# Patient Record
Sex: Female | Born: 1980 | Race: Black or African American | Hispanic: No | Marital: Single
Health system: Southern US, Community
[De-identification: ages and names within clinical notes are randomized; demographics above are authoritative.]

---

## 2021-11-16 ENCOUNTER — Emergency Department (HOSPITAL_BASED_OUTPATIENT_CLINIC_OR_DEPARTMENT_OTHER): Payer: Self-pay

## 2021-11-16 ENCOUNTER — Other Ambulatory Visit: Payer: Self-pay

## 2021-11-16 ENCOUNTER — Encounter (HOSPITAL_BASED_OUTPATIENT_CLINIC_OR_DEPARTMENT_OTHER): Payer: Self-pay

## 2021-11-16 ENCOUNTER — Emergency Department (HOSPITAL_BASED_OUTPATIENT_CLINIC_OR_DEPARTMENT_OTHER)
Admission: EM | Admit: 2021-11-16 | Discharge: 2021-11-16 | Disposition: A | Discharge status: Home / Self Care | Payer: Self-pay | Attending: Emergency Medicine | Admitting: Emergency Medicine

## 2021-11-16 DIAGNOSIS — R748 Abnormal levels of other serum enzymes: Secondary | ICD-10-CM | POA: Insufficient documentation

## 2021-11-16 DIAGNOSIS — K859 Acute pancreatitis without necrosis or infection, unspecified: Secondary | ICD-10-CM | POA: Insufficient documentation

## 2021-11-16 DIAGNOSIS — B9689 Other specified bacterial agents as the cause of diseases classified elsewhere: Secondary | ICD-10-CM

## 2021-11-16 DIAGNOSIS — A5901 Trichomonal vulvovaginitis: Secondary | ICD-10-CM | POA: Insufficient documentation

## 2021-11-16 DIAGNOSIS — A599 Trichomoniasis, unspecified: Secondary | ICD-10-CM

## 2021-11-16 LAB — COMPREHENSIVE METABOLIC PANEL
ALT: 14 U/L (ref 0–44)
AST: 23 U/L (ref 15–41)
Albumin: 4.6 g/dL (ref 3.5–5.0)
Alkaline Phosphatase: 63 U/L (ref 38–126)
Anion gap: 13 (ref 5–15)
BUN: 8 mg/dL (ref 6–20)
CO2: 26 mmol/L (ref 22–32)
Calcium: 9.7 mg/dL (ref 8.9–10.3)
Chloride: 93 mmol/L — ABNORMAL LOW (ref 98–111)
Creatinine, Ser: 0.87 mg/dL (ref 0.44–1.00)
GFR, Estimated: >60 mL/min (ref >60)
Glucose, Bld: 126 mg/dL — ABNORMAL HIGH (ref 70–99)
Potassium: 3.5 mmol/L (ref 3.5–5.1)
Sodium: 132 mmol/L — ABNORMAL LOW (ref 135–145)
Total Bilirubin: 0.8 mg/dL (ref 0.3–1.2)
Total Protein: 8.8 g/dL — ABNORMAL HIGH (ref 6.5–8.1)

## 2021-11-16 LAB — URINALYSIS, MICROSCOPIC (REFLEX)

## 2021-11-16 LAB — WET PREP, GENITAL
Sperm: NONE SEEN
WBC, Wet Prep HPF POC: >=10 — AB (ref <10)
Yeast Wet Prep HPF POC: NONE SEEN

## 2021-11-16 LAB — URINALYSIS, ROUTINE W REFLEX MICROSCOPIC
Glucose, UA: NEGATIVE mg/dL
Hgb urine dipstick: NEGATIVE
Ketones, ur: NEGATIVE mg/dL
Nitrite: POSITIVE — AB
Protein, ur: 100 mg/dL — AB
Specific Gravity, Urine: 1.025 (ref 1.005–1.030)
pH: 6 (ref 5.0–8.0)

## 2021-11-16 LAB — CBC
HCT: 48.3 % — ABNORMAL HIGH (ref 36.0–46.0)
Hemoglobin: 16.3 g/dL — ABNORMAL HIGH (ref 12.0–15.0)
MCH: 30.4 pg (ref 26.0–34.0)
MCHC: 33.7 g/dL (ref 30.0–36.0)
MCV: 90.1 fL (ref 80.0–100.0)
Platelets: 260 10*3/uL (ref 150–400)
RBC: 5.36 MIL/uL — ABNORMAL HIGH (ref 3.87–5.11)
RDW: 14.8 % (ref 11.5–15.5)
WBC: 5.1 10*3/uL (ref 4.0–10.5)
nRBC: 0 % (ref 0.0–0.2)

## 2021-11-16 LAB — PREGNANCY, URINE: Preg Test, Ur: NEGATIVE

## 2021-11-16 LAB — LIPASE, BLOOD: Lipase: 69 U/L — ABNORMAL HIGH (ref 11–51)

## 2021-11-16 MED ORDER — PANTOPRAZOLE SODIUM 20 MG PO TBEC
20.0000 mg | DELAYED_RELEASE_TABLET | Freq: Every day | ORAL | 0 refills | Status: AC
Start: 2021-11-16 — End: 2021-12-16

## 2021-11-16 MED ORDER — OXYCODONE-ACETAMINOPHEN 5-325 MG PO TABS: 1.0000 | ORAL_TABLET | Freq: Four times a day (QID) | ORAL | 0 refills | Status: AC | PRN

## 2021-11-16 MED ORDER — IOHEXOL 300 MG/ML  SOLN
100.0000 mL | Freq: Once | INTRAMUSCULAR | Status: AC | PRN
  Administered 2021-11-16: 100 mL via INTRAVENOUS

## 2021-11-16 MED ORDER — ONDANSETRON HCL 4 MG/2ML IJ SOLN
4.0000 mg | Freq: Once | INTRAMUSCULAR | Status: AC
  Administered 2021-11-16: 4 mg via INTRAVENOUS
  Filled 2021-11-16: qty 2

## 2021-11-16 MED ORDER — ONDANSETRON 4 MG PO TBDP: 4.0000 mg | ORAL_TABLET | Freq: Three times a day (TID) | ORAL | 0 refills | Status: AC | PRN

## 2021-11-16 MED ORDER — MORPHINE SULFATE (PF) 4 MG/ML IV SOLN
4.0000 mg | Freq: Once | INTRAVENOUS | Status: AC
  Administered 2021-11-16: 4 mg via INTRAVENOUS
  Filled 2021-11-16: qty 1

## 2021-11-16 MED ORDER — DOXYCYCLINE HYCLATE 100 MG PO CAPS: 100.0000 mg | ORAL_CAPSULE | Freq: Two times a day (BID) | ORAL | 0 refills | Status: AC

## 2021-11-16 MED ORDER — LIDOCAINE HCL (PF) 1 % IJ SOLN
1.0000 mL | Freq: Once | INTRAMUSCULAR | Status: AC
  Administered 2021-11-16: 1 mL
  Filled 2021-11-16: qty 5

## 2021-11-16 MED ORDER — METRONIDAZOLE 500 MG PO TABS: 500.0000 mg | ORAL_TABLET | Freq: Two times a day (BID) | ORAL | 0 refills | Status: AC

## 2021-11-16 MED ORDER — CEFTRIAXONE SODIUM 500 MG IJ SOLR
500.0000 mg | Freq: Once | INTRAMUSCULAR | Status: AC
  Administered 2021-11-16: 500 mg via INTRAMUSCULAR
  Filled 2021-11-16: qty 500

## 2021-11-16 MED ORDER — SODIUM CHLORIDE 0.9 % IV BOLUS
1000.0000 mL | Freq: Once | INTRAVENOUS | Status: AC
  Administered 2021-11-16: 1000 mL via INTRAVENOUS

## 2021-11-16 NOTE — ED Triage Notes (Signed)
Pt c/o epigastric pain radiating to her flank for approximately one week. Pt also endorses N/V during that time.

## 2021-11-16 NOTE — ED Provider Notes (Signed)
[facility] EMERGENCY DEPARTMENT Provider Note   CSN: [id number] Arrival date & time: 11/16/21  1614     History  Chief Complaint  Patient presents with   Abdominal Pain    Lindsay Tapia is a 41 y.o. female with no reported past medical history.  Presents to the emergency department with a chief complaint of abdominal pain and thoracic back pain.  Patient reports that abdominal pain has been present over the last week.  Pain is located to her epigastric area.  Pain radiates to bilateral flanks.  Patient states that pain has been constant however waxes and wanes in intensity.  Patient endorses nausea and vomiting.  States that he is having difficulty tolerating p.o. intake.  Patient reports vomiting 7 times in the last 24 hours.  Describes emesis as stomach contents and bilious.  Patient endorses thoracic back pain over the last 4 days.  Pain has been constant over this time.  Pain waxes and wanes in intensity.  Patient has tried heat and ice with no improvement in her pain.  Denies any recent falls or injuries.  LMP 12/21.  Patient is not on any forms of birth control.  Patient is sexually active in a mutually monogamous relationship with a female partner.  G 11 P00 11 0.  Patient reports drinking 4 alcoholic drinks per day.  Patient denies any illicit drug use.  Denies any history of abdominal surgeries.  Patient denies any fever, chills, dysuria, hematuria, urinary urgency, vaginal pain, vaginal bleeding, vaginal discharge, abdominal distention, blood in stool, melena, constipation, diarrhea.   Abdominal Pain Associated symptoms: nausea and vomiting   Associated symptoms: no chest pain, no chills, no constipation, no diarrhea, no dysuria, no fever, no hematuria, no shortness of breath, no vaginal bleeding and no vaginal discharge       Home Medications Prior to Admission medications   Not on File      Allergies    Pineapple    Review of Systems   Review of Systems   Constitutional:  Negative for chills and fever.  Eyes:  Negative for visual disturbance.  Respiratory:  Negative for shortness of breath.   Cardiovascular:  Negative for chest pain.  Gastrointestinal:  Positive for abdominal pain, nausea and vomiting. Negative for abdominal distention, anal bleeding, blood in stool, constipation, diarrhea and rectal pain.  Genitourinary:  Negative for difficulty urinating, dysuria, flank pain, frequency, genital sores, hematuria, pelvic pain, urgency, vaginal bleeding, vaginal discharge and vaginal pain.  Musculoskeletal:  Positive for back pain. Negative for neck pain.  Skin:  Negative for color change and rash.  Neurological:  Negative for dizziness, syncope, light-headedness and headaches.  Psychiatric/Behavioral:  Negative for confusion.    Physical Exam Updated Vital Signs BP (!) 186/126 (BP Location: Left Arm)    Pulse (!) 118    Temp 98.3 F (36.8 C) (Oral)    Resp 18    Ht 5\' 9"  (1.753 m)    Wt 89.8 kg    SpO2 100%    BMI 29.24 kg/m  Physical Exam Vitals and nursing note reviewed. Exam conducted with a chaperone present (Female nurse tech present as chaperone).  Constitutional:      General: She is not in acute distress.    Appearance: She is not ill-appearing, toxic-appearing or diaphoretic.  HENT:     Head: Normocephalic.  Eyes:     General: No scleral icterus.       Right eye: No discharge.  Left eye: No discharge.  Cardiovascular:     Rate and Rhythm: Normal rate.  Pulmonary:     Effort: Pulmonary effort is normal.  Abdominal:     General: Abdomen is protuberant. Bowel sounds are normal. There is no distension. There are no signs of injury.     Palpations: Abdomen is soft. There is no mass or pulsatile mass.     Tenderness: There is abdominal tenderness in the right upper quadrant and epigastric area. There is no right CVA tenderness, left CVA tenderness, guarding or rebound. Positive signs include Murphy's sign.     Hernia:  There is no hernia in the umbilical area, ventral area, left inguinal area or right inguinal area.  Genitourinary:    Exam position: Knee-chest position.     Pubic Area: No rash or pubic lice.      Tanner stage (genital): 5.     Labia:        Right: No rash, tenderness, lesion or injury.        Left: No rash, tenderness, lesion or injury.      Vagina: No signs of injury and foreign body. Vaginal discharge present. No erythema, tenderness, bleeding, lesions or prolapsed vaginal walls.     Cervix: No cervical motion tenderness, discharge, friability, lesion, erythema, cervical bleeding or eversion.     Uterus: Not enlarged and not tender.      Adnexa: Right adnexa normal and left adnexa normal.     Comments: M5 moderate amount of white discharge noted to vaginal vault. Lymphadenopathy:     Lower Body: No right inguinal adenopathy. No left inguinal adenopathy.  Skin:    General: Skin is warm and dry.  Neurological:     General: No focal deficit present.     Mental Status: She is alert.  Psychiatric:        Behavior: Behavior is cooperative.    ED Results / Procedures / Treatments   Labs (all labs ordered are listed, but only abnormal results are displayed) Labs Reviewed  WET PREP, GENITAL - Abnormal; Notable for the following components:      Result Value   Trich, Wet Prep PRESENT (*)    Clue Cells Wet Prep HPF POC PRESENT (*)    WBC, Wet Prep HPF POC >=10 (*)    All other components within normal limits  LIPASE, BLOOD - Abnormal; Notable for the following components:   Lipase 69 (*)    All other components within normal limits  COMPREHENSIVE METABOLIC PANEL - Abnormal; Notable for the following components:   Sodium 132 (*)    Chloride 93 (*)    Glucose, Bld 126 (*)    Total Protein 8.8 (*)    All other components within normal limits  CBC - Abnormal; Notable for the following components:   RBC 5.36 (*)    Hemoglobin 16.3 (*)    HCT 48.3 (*)    All other components within  normal limits  URINALYSIS, ROUTINE W REFLEX MICROSCOPIC - Abnormal; Notable for the following components:   APPearance CLOUDY (*)    Bilirubin Urine LARGE (*)    Protein, ur 100 (*)    Nitrite POSITIVE (*)    Leukocytes,Ua SMALL (*)    All other components within normal limits  URINALYSIS, MICROSCOPIC (REFLEX) - Abnormal; Notable for the following components:   Bacteria, UA FEW (*)    Trichomonas, UA PRESENT (*)    All other components within normal limits  PREGNANCY, URINE  GC/CHLAMYDIA PROBE  AMP ([place]) NOT AT [facility]    EKG None  Radiology CT ABDOMEN PELVIS W CONTRAST  Result Date: 11/16/2021 CLINICAL DATA:  Epigastric pain. EXAM: CT ABDOMEN AND PELVIS WITH CONTRAST TECHNIQUE: Multidetector CT imaging of the abdomen and pelvis was performed using the standard protocol following bolus administration of intravenous contrast. RADIATION DOSE REDUCTION: This exam was performed according to the departmental dose-optimization program which includes automated exposure control, adjustment of the mA and/or kV according to patient size and/or use of iterative reconstruction technique. CONTRAST:  161mL OMNIPAQUE IOHEXOL 300 MG/ML  SOLN COMPARISON:  None. FINDINGS: Lower chest: No acute abnormality. Hepatobiliary: There is focal fatty infiltration along the falciform ligament. The liver is otherwise within normal limits. Gallbladder and bile ducts are within normal limits. Pancreas: There is mild inflammatory stranding surrounding the head of the pancreas. No pancreatic ductal dilatation. No focal fluid collections. Spleen: Normal in size without focal abnormality. Adrenals/Urinary Tract: There is a rounded hypodensity in the left kidney which is too small to characterize, likely cysts. Otherwise, the kidneys, bladder and adrenal glands are within normal limits. Stomach/Bowel: There is a rounded 2.8 cm fluid attenuation structure in the gastrohepatic region adjacent to the gastroesophageal junction.  This is well-circumscribed without surrounding inflammation. Stomach is otherwise within normal limits. There is mild inflammatory stranding surrounding the duodenum. No bowel obstruction. Appendix within normal limits. Vascular/Lymphatic: Aortic atherosclerosis. No enlarged abdominal or pelvic lymph nodes. Reproductive: Uterus and bilateral adnexa are unremarkable. Other: No abdominal wall hernia or abnormality. No abdominopelvic ascites. Musculoskeletal: No acute or significant osseous findings. IMPRESSION: 1. Mild inflammation surrounding the head of the pancreas worrisome for acute uncomplicated pancreatitis. 2. Mild inflammation surrounding the duodenum may be reactive secondary to pancreatitis. Other etiologies for duodenitis not excluded. 3. Rounded fluid attenuation structure measuring 2.8 cm near the gastroesophageal junction. Differential diagnosis includes gastric duplication cyst, gastric diverticulum, exophytic liver cyst. Consider further evaluation with upper GI study or MRI if clinically warranted. Electronically Signed   By: Ronney Asters M.D.   On: 11/16/2021 19:10    Procedures Procedures    Medications Ordered in ED Medications  cefTRIAXone (ROCEPHIN) injection 500 mg (500 mg Intramuscular Given 11/16/21 1831)  lidocaine (PF) (XYLOCAINE) 1 % injection 1 mL (1 mL Other Given 11/16/21 1831)  ondansetron (ZOFRAN) injection 4 mg (4 mg Intravenous Given 11/16/21 1831)  morphine 4 MG/ML injection 4 mg (4 mg Intravenous Given 11/16/21 1830)  iohexol (OMNIPAQUE) 300 MG/ML solution 100 mL (100 mLs Intravenous Contrast Given 11/16/21 1844)  sodium chloride 0.9 % bolus 1,000 mL (0 mLs Intravenous Stopped 11/16/21 2017)    ED Course/ Medical Decision Making/ A&P                           Medical Decision Making Amount and/or Complexity of Data Reviewed Labs: ordered. Radiology: ordered.  Risk Prescription drug management.   This patient presents to the ED for concern of abdominal pain,  nausea, and vomiting, this involves an extensive number of treatment options, and is a complaint that carries with it a high risk of complications and morbidity.  The differential diagnosis includes but is not limited to acute pancreatitis, cholecystitis, renal/ureteral calculus, viral gastroenteritis.   Co morbidities that complicate the patient evaluation  N/a   Additional history obtained:  External records from outside source obtained and reviewed    Lab Tests:  I Ordered, and personally interpreted labs.  The pertinent results include:  Lipase elevated at 69 CBC shows hemoconcentration likely secondary to dehydration CMP shows sodium 132, chloride 93 UA shows bacteria few, WBC 6-10, leukocyte small, nitrite positive, trichomonas present Urine pregnancy is negative Wet prep shows trichomonas and clue cells present.   Imaging Studies ordered:  I ordered imaging studies including CT abdomen pelvis I independently visualized and interpreted imaging which showed mild inflammation surrounding head of the pancreas worrisome for acute uncomplicated pancreatitis, mild inflammation surrounding the duodenum to be reactive secondary to pancreatitis.  Rounded fluid attenuation structure measuring 2.8 cm near the gastroesophageal junction. I agree with the radiologist interpretation    Medicines ordered and prescription drug management:  I ordered medication including morphine for pain management.  Zofran for nausea Reevaluation of the patient after these medicines showed that the patient improved I have reviewed the patients home medicines and have made adjustments as needed  Problem List / ED Course:  Epigastric abdominal pain Abdomen soft, nondistended, tenderness to epigastric area.  Lipase elevated at 69.  Due to tenderness will obtain CT abdomen pelvis to evaluate for possible PE pancreatitis versus acute cholecystitis. CT results as noted above.  We will attempt to manage  patient's pain in the emergency department and p.o. challenge.  If able to tolerate p.o. intake we will plan to discharge with GI follow-up.  Patient able to handle p.o. intake without difficulty.  Pain is adequately managed at this time. Patient's urinalysis and wet prep showed trichomonas.  Patient denies any vaginal or urinary complaints.  Noted to have moderate amount of white discharge to vaginal vault on exam.  Patient empirically treated for gonorrhea and chlamydia.  We will treat patient with 7-day course of Flagyl.  Patient was offered testing for HIV and syphilis however declines at this time.    Patient care discussed with attending physician Dr.Yao    Reevaluation:  After the interventions noted above, I reevaluated the patient and found that they have :improved   Disposition:  After consideration of the diagnostic results and the patients response to treatment, I feel that the patent would benefit from discharge and follow-up with gastroenterologist in outpatient setting..          Final Clinical Impression(s) / ED Diagnoses Final diagnoses:  Acute pancreatitis without infection or necrosis, unspecified pancreatitis type  Trichomonas infection  BV (bacterial vaginosis)    Rx / DC Orders ED Discharge Orders          Ordered    oxyCODONE-acetaminophen (PERCOCET/ROXICET) 5-325 MG tablet  Every 6 hours PRN        11/16/21 2030    ondansetron (ZOFRAN-ODT) 4 MG disintegrating tablet  Every 8 hours PRN        11/16/21 2030    pantoprazole (PROTONIX) 20 MG tablet  Daily        11/16/21 2030    metroNIDAZOLE (FLAGYL) 500 MG tablet  2 times daily        11/16/21 2030    doxycycline (VIBRAMYCIN) 100 MG capsule  2 times daily        11/16/21 2030              Dyann Ruddle 11/16/21 2243    Drenda Freeze, MD 11/17/21 1550

## 2021-11-16 NOTE — Discharge Instructions (Addendum)
[facility] to be evaluated for your abdominal pain.  Your CT scan was consistent with acute pancreatitis.  Due to this you need to use a clear liquid diet for the next 3 to 5 days.  After that you may progress to a bland diet and then to a normal diet as tolerated.  I have given you prescription for Percocet pain medication, you may take 1 pill every 6 hours as needed for severe pain.  I have also given you prescription for Zofran, you may take 1 pill every 8 hours as needed for nausea/vomiting.  Please follow-up with the gastroenterologist listed on this paperwork for further evaluation.  Your urinalysis and wet prep showed you are positive for trichomonas and bacterial vaginosis.  Due to this you were started on the medication Flagyl.  Do not drink alcohol with this medication as it will cause violent illness.  You were also empirically treated for gonorrhea and chlamydia.  You have a prescription for doxycycline.  This medication can cause increased sensitivity to the sun.  Please follow-up with the OB/GYN listed on this paperwork.  Please do not have sex for the next 2 hours.  Please tell any sexual partners about your positive test results.  Get help right away if: You cannot eat or keep fluids down. Your pain becomes severe. Your skin or the white part of your eyes turns yellow (jaundice). You have sudden swelling in your abdomen. You vomit. You feel dizzy or you faint. Your blood sugar is high (over 300 mg/dL).

## 2021-11-16 NOTE — ED Notes (Signed)
Pt provided with ice for her Gatorade.

## 2021-11-17 LAB — GC/CHLAMYDIA PROBE AMP (~~LOC~~) NOT AT ARMC
Chlamydia: NEGATIVE
Comment: NEGATIVE
Comment: NORMAL
Neisseria Gonorrhea: NEGATIVE

## 2023-01-07 IMAGING — CT CT ABD-PELV W/ CM
2 of 4 series · 16 of 46 positions shown, 18 images · IV contrast (agent unspecified)
Comparison: None.

CLINICAL DATA: Epigastric pain.

EXAM:
CT ABDOMEN AND PELVIS WITH CONTRAST
TECHNIQUE: Multidetector CT imaging of the abdomen and pelvis was performed
using the standard protocol following bolus administration of
intravenous contrast.

[Series 2: axial st · axial · 0.97mm/px · z∈[+809,+1224]mm · 13 of 91 slices shown, 15 images]
[im 4/91  soft-tissue]
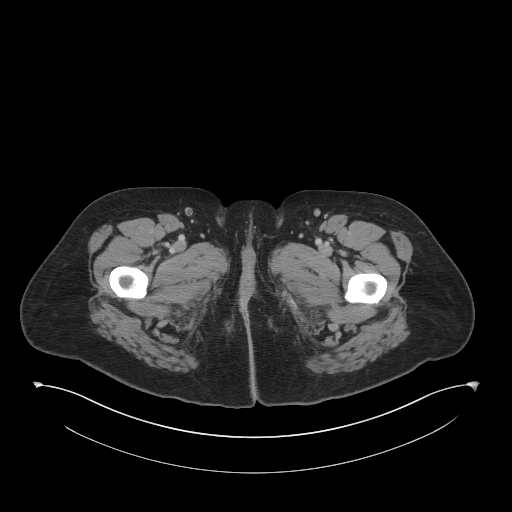
[im 4/91  bone]
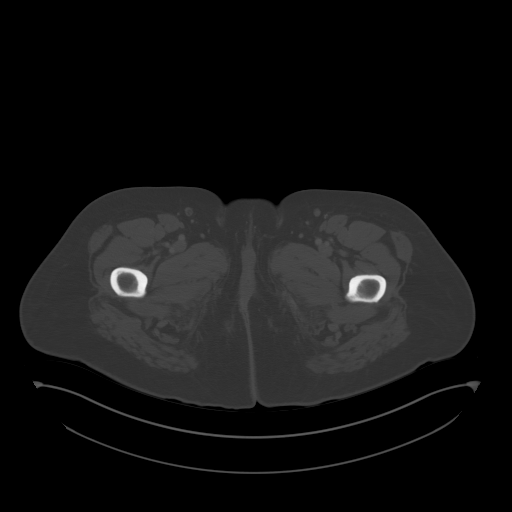
[im 12/91  soft-tissue]
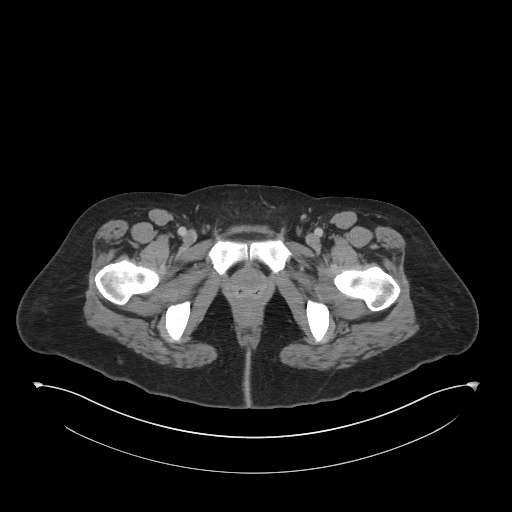
[im 19/91  soft-tissue]
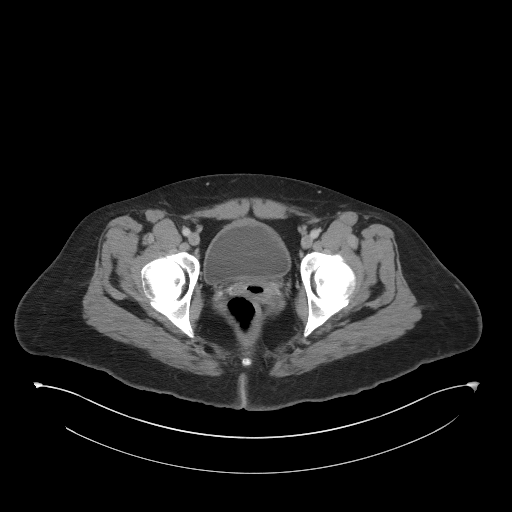
[im 27/91  soft-tissue]
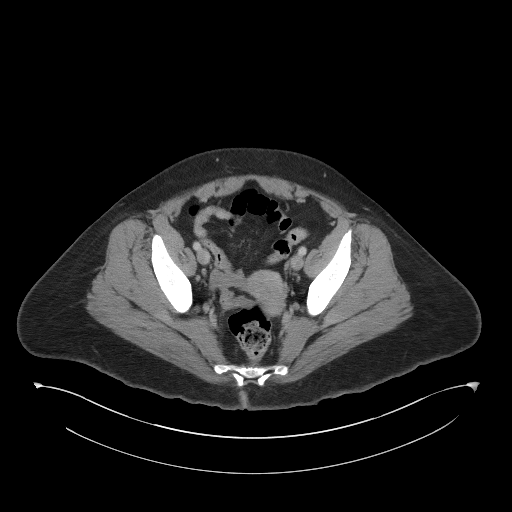
[im 31/91  soft-tissue]
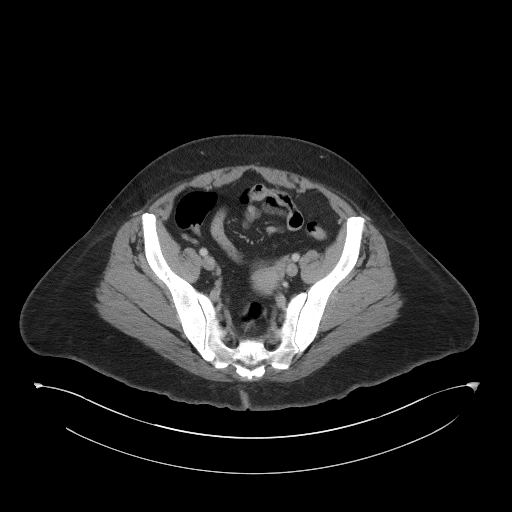
[im 38/91  soft-tissue]
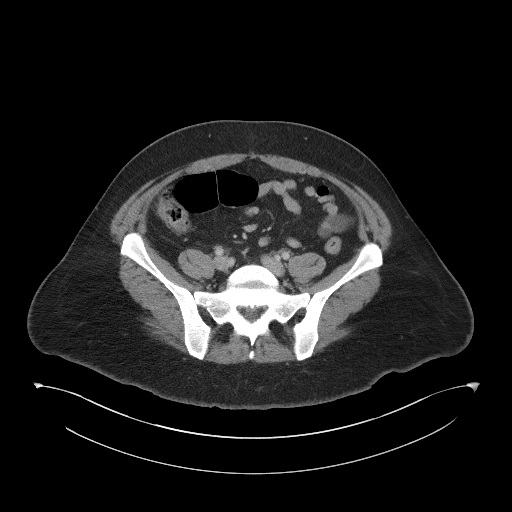
[im 46/91  soft-tissue]
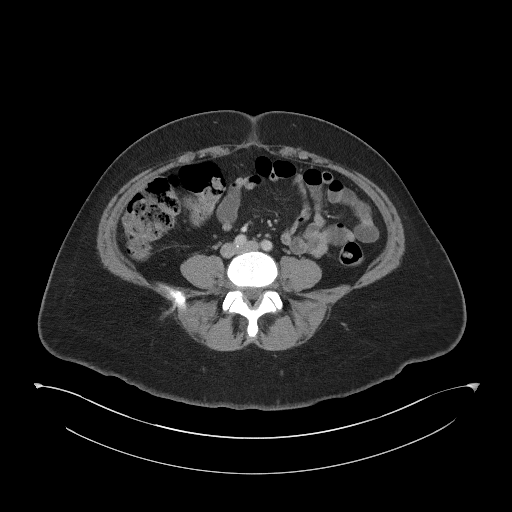
[im 53/91  soft-tissue]
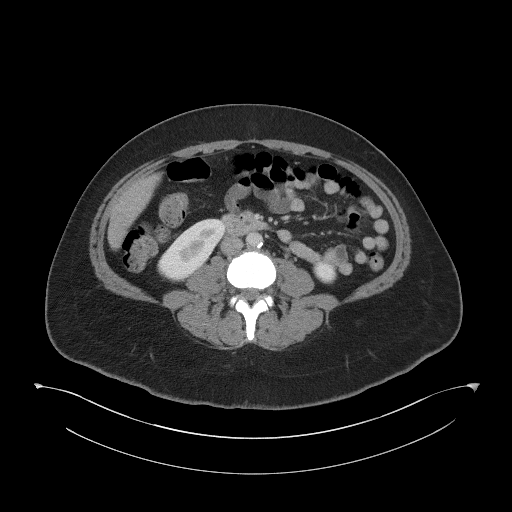
[im 61/91  soft-tissue]
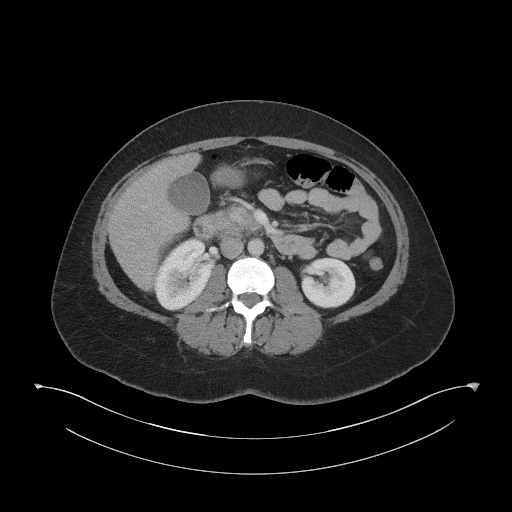
[im 61/91  bone]
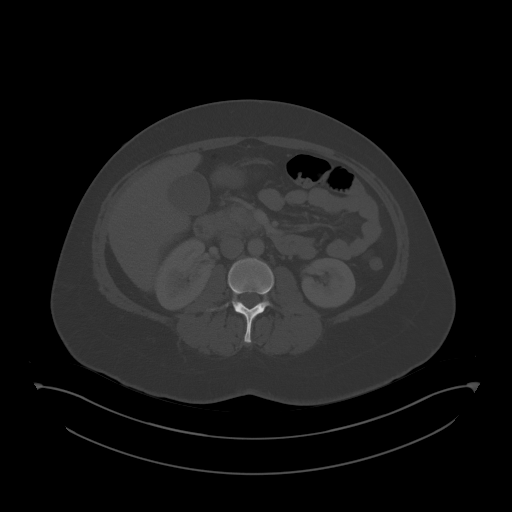
[im 64/91  soft-tissue]
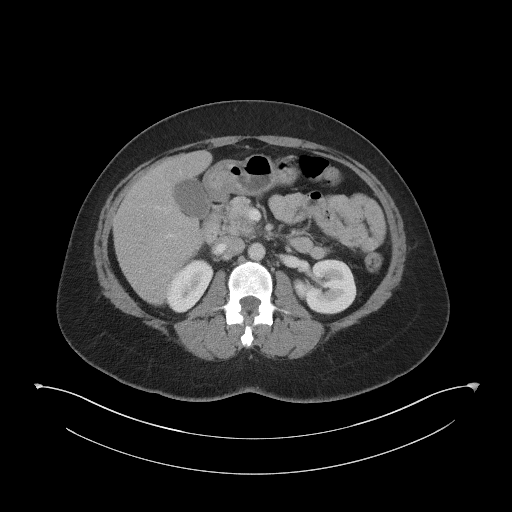
[im 72/91  soft-tissue]
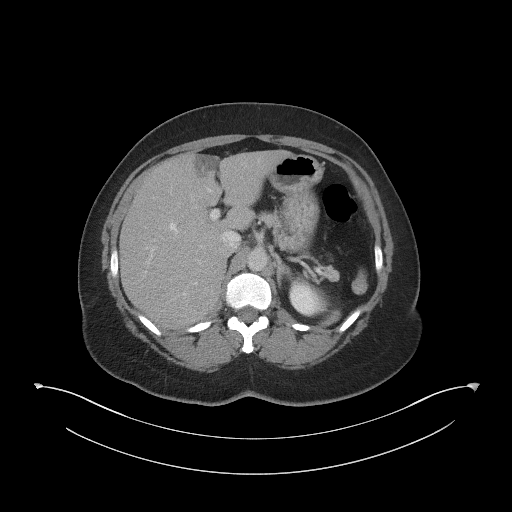
[im 79/91  soft-tissue]
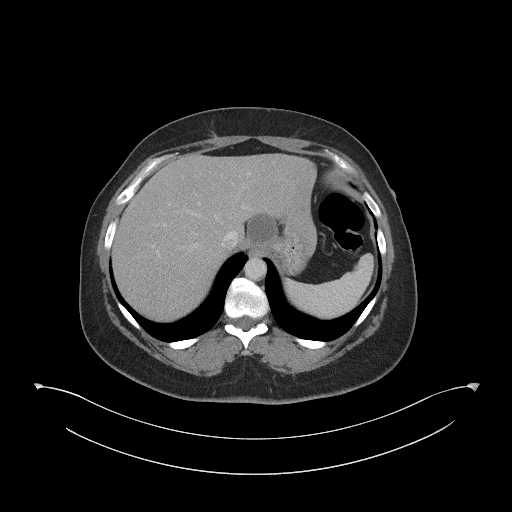
[im 87/91  soft-tissue]
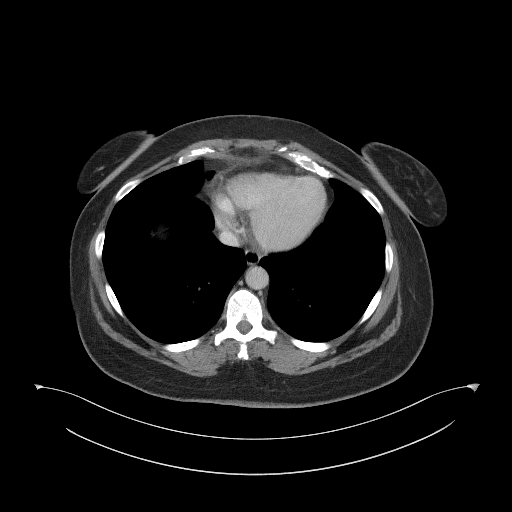

[Series 5: coronal st · coronal · 0.86mm/px · 3 of 107 slices shown]
[im 36/107  soft-tissue]
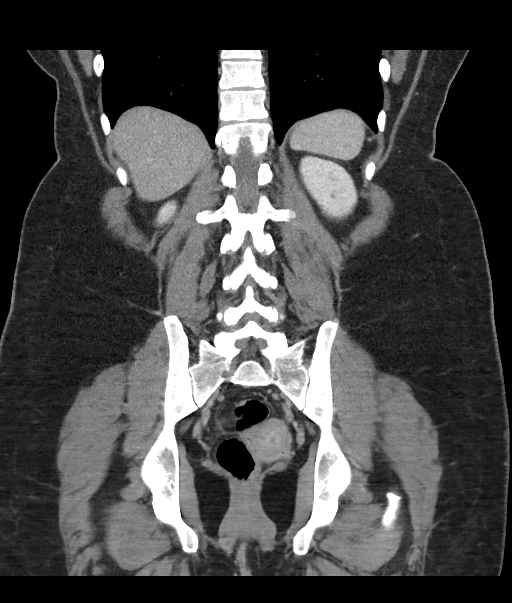
[im 48/107  soft-tissue]
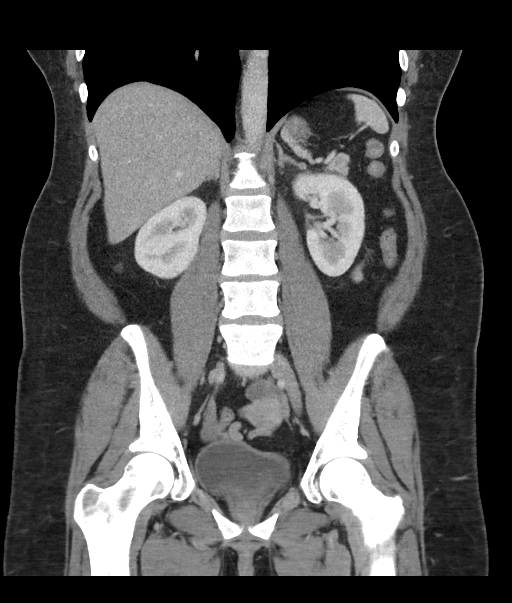
[im 59/107  soft-tissue]
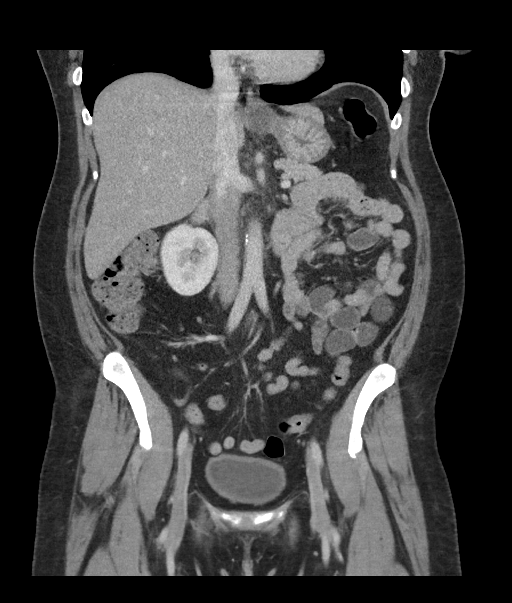

[16 of 46 positions shown; findings below may reference images not displayed]

RADIATION DOSE REDUCTION: This exam was performed according to the
departmental dose-optimization program which includes automated
exposure control, adjustment of the mA and/or kV according to
patient size and/or use of iterative reconstruction technique.

CONTRAST:  100mL OMNIPAQUE IOHEXOL 300 MG/ML  SOLN
FINDINGS: Lower chest: No acute abnormality.

Hepatobiliary: There is focal fatty infiltration along the falciform
ligament. The liver is otherwise within normal limits. Gallbladder
and bile ducts are within normal limits.

Pancreas: There is mild inflammatory stranding surrounding the head
of the pancreas. No pancreatic ductal dilatation. No focal fluid
collections.

Spleen: Normal in size without focal abnormality.

Adrenals/Urinary Tract: There is a rounded hypodensity in the left
kidney which is too small to characterize, likely cysts. Otherwise,
the kidneys, bladder and adrenal glands are within normal limits.

Stomach/Bowel: There is a rounded 2.8 cm fluid attenuation structure
in the gastrohepatic region adjacent to the gastroesophageal
junction. This is well-circumscribed without surrounding
inflammation. Stomach is otherwise within normal limits. There is
mild inflammatory stranding surrounding the duodenum. No bowel
obstruction. Appendix within normal limits.

Vascular/Lymphatic: Aortic atherosclerosis. No enlarged abdominal or
pelvic lymph nodes.

Reproductive: Uterus and bilateral adnexa are unremarkable.

Other: No abdominal wall hernia or abnormality. No abdominopelvic
ascites.

Musculoskeletal: No acute or significant osseous findings.
IMPRESSION: 1. Mild inflammation surrounding the head of the pancreas worrisome
for acute uncomplicated pancreatitis.
2. Mild inflammation surrounding the duodenum may be reactive
secondary to pancreatitis. Other etiologies for duodenitis not
excluded.
3. Rounded fluid attenuation structure measuring 2.8 cm near the
gastroesophageal junction. Differential diagnosis includes gastric
duplication cyst, gastric diverticulum, exophytic liver cyst.
Consider further evaluation with upper GI study or MRI if clinically
warranted.
